# Patient Record
Sex: Female | Born: 1995 | Race: Black or African American | Hispanic: No | Marital: Single | Smoking: Never smoker
Health system: Southern US, Community
[De-identification: ages and names within clinical notes are randomized; demographics above are authoritative.]

---

## 2017-10-16 ENCOUNTER — Encounter (HOSPITAL_COMMUNITY): Payer: Self-pay | Admitting: Emergency Medicine

## 2017-10-16 ENCOUNTER — Emergency Department (HOSPITAL_COMMUNITY): Payer: BLUE CROSS/BLUE SHIELD

## 2017-10-16 ENCOUNTER — Emergency Department (HOSPITAL_COMMUNITY)
Admission: EM | Admit: 2017-10-16 | Discharge: 2017-10-17 | Disposition: A | Discharge status: Home / Self Care | Payer: BLUE CROSS/BLUE SHIELD | Attending: Emergency Medicine | Admitting: Emergency Medicine

## 2017-10-16 ENCOUNTER — Other Ambulatory Visit: Payer: Self-pay

## 2017-10-16 DIAGNOSIS — J181 Lobar pneumonia, unspecified organism: Secondary | ICD-10-CM | POA: Diagnosis not present

## 2017-10-16 DIAGNOSIS — R05 Cough: Secondary | ICD-10-CM | POA: Diagnosis present

## 2017-10-16 DIAGNOSIS — J189 Pneumonia, unspecified organism: Secondary | ICD-10-CM

## 2017-10-16 LAB — I-STAT CG4 LACTIC ACID, ED
LACTIC ACID, VENOUS: 1.11 mmol/L (ref 0.5–1.9)
LACTIC ACID, VENOUS: 1.51 mmol/L (ref 0.5–1.9)

## 2017-10-16 LAB — CBC WITH DIFFERENTIAL/PLATELET
Abs Immature Granulocytes: 0 10*3/uL (ref 0.0–0.1)
BASOS ABS: 0 10*3/uL (ref 0.0–0.1)
BASOS PCT: 0 %
EOS ABS: 0.2 10*3/uL (ref 0.0–0.7)
Eosinophils Relative: 3 %
HCT: 37.8 % (ref 36.0–46.0)
Hemoglobin: 12.3 g/dL (ref 12.0–15.0)
IMMATURE GRANULOCYTES: 1 %
Lymphocytes Relative: 18 %
Lymphs Abs: 1.1 10*3/uL (ref 0.7–4.0)
MCH: 29.3 pg (ref 26.0–34.0)
MCHC: 32.5 g/dL (ref 30.0–36.0)
MCV: 90 fL (ref 78.0–100.0)
Monocytes Absolute: 0.7 10*3/uL (ref 0.1–1.0)
Monocytes Relative: 12 %
NEUTROS ABS: 3.9 10*3/uL (ref 1.7–7.7)
NEUTROS PCT: 66 %
Platelets: 176 10*3/uL (ref 150–400)
RBC: 4.2 MIL/uL (ref 3.87–5.11)
RDW: 11.9 % (ref 11.5–15.5)
WBC: 5.9 10*3/uL (ref 4.0–10.5)

## 2017-10-16 LAB — POC URINE PREG, ED: PREG TEST UR: NEGATIVE

## 2017-10-16 LAB — URINALYSIS, ROUTINE W REFLEX MICROSCOPIC
BILIRUBIN URINE: NEGATIVE
Glucose, UA: NEGATIVE mg/dL
KETONES UR: 80 mg/dL — AB
LEUKOCYTES UA: NEGATIVE
NITRITE: NEGATIVE
PH: 5 (ref 5.0–8.0)
Protein, ur: 100 mg/dL — AB
RBC / HPF: >50 RBC/hpf — ABNORMAL HIGH (ref 0–5)
Specific Gravity, Urine: 1.03 (ref 1.005–1.030)

## 2017-10-16 LAB — BASIC METABOLIC PANEL
ANION GAP: 13 (ref 5–15)
BUN: 12 mg/dL (ref 6–20)
CO2: 23 mmol/L (ref 22–32)
Calcium: 8.7 mg/dL — ABNORMAL LOW (ref 8.9–10.3)
Chloride: 98 mmol/L (ref 98–111)
Creatinine, Ser: 0.91 mg/dL (ref 0.44–1.00)
GFR calc Af Amer: >60 mL/min (ref >60)
GFR calc non Af Amer: >60 mL/min (ref >60)
GLUCOSE: 108 mg/dL — AB (ref 70–99)
POTASSIUM: 3.8 mmol/L (ref 3.5–5.1)
Sodium: 134 mmol/L — ABNORMAL LOW (ref 135–145)

## 2017-10-16 LAB — GROUP A STREP BY PCR: GROUP A STREP BY PCR: NOT DETECTED

## 2017-10-16 MED ORDER — AZITHROMYCIN 250 MG PO TABS
1000.0000 mg | ORAL_TABLET | Freq: Once | ORAL | Status: DC
  Filled 2017-10-16: qty 4

## 2017-10-16 MED ORDER — SODIUM CHLORIDE 0.9 % IV BOLUS
2000.0000 mL | Freq: Once | INTRAVENOUS | Status: AC
  Administered 2017-10-16: 2000 mL via INTRAVENOUS

## 2017-10-16 MED ORDER — SODIUM CHLORIDE 0.9 % IV BOLUS (SEPSIS): 1000.0000 mL | Freq: Once | INTRAVENOUS | Status: DC

## 2017-10-16 MED ORDER — SODIUM CHLORIDE 0.9 % IV SOLN
1.0000 g | Freq: Once | INTRAVENOUS | Status: AC
  Administered 2017-10-16: 1 g via INTRAVENOUS
  Filled 2017-10-16: qty 10

## 2017-10-16 MED ORDER — SODIUM CHLORIDE 0.9 % IV BOLUS (SEPSIS): 250.0000 mL | Freq: Once | INTRAVENOUS | Status: DC

## 2017-10-16 MED ORDER — IPRATROPIUM-ALBUTEROL 0.5-2.5 (3) MG/3ML IN SOLN
3.0000 mL | Freq: Once | RESPIRATORY_TRACT | Status: AC
  Administered 2017-10-16: 3 mL via RESPIRATORY_TRACT
  Filled 2017-10-16: qty 3

## 2017-10-16 MED ORDER — ACETAMINOPHEN 500 MG PO TABS
1000.0000 mg | ORAL_TABLET | Freq: Once | ORAL | Status: AC
  Administered 2017-10-16: 1000 mg via ORAL
  Filled 2017-10-16: qty 2

## 2017-10-16 MED ORDER — SODIUM CHLORIDE 0.9 % IV SOLN
500.0000 mg | Freq: Once | INTRAVENOUS | Status: AC
  Administered 2017-10-17: 500 mg via INTRAVENOUS
  Filled 2017-10-16: qty 500

## 2017-10-16 NOTE — ED Notes (Signed)
PA at bedside will collect 2nd set of blood cultures once PA is done.

## 2017-10-16 NOTE — ED Provider Notes (Signed)
Patient placed in Quick Look pathway, seen and evaluated   Chief Complaint: flu like symptoms  HPI: Courtney Fry is a 22 y.o. female who presents to the ED with fever, chills, body aches, diarrhea. Patient reports the symptoms started a few days ago and she went to her student health and was evaluated and told she had a URI. Patient reports that since then she has gotten worse.   ROS: General: fever, chills, body aches  Resp: cough  ENT: sore throat  Physical Exam:  BP 108/77 (BP Location: Right Arm)   Pulse (!) 130   Temp (!) 103.3 F (39.6 C) (Oral)   Resp 16   Ht 5\' 7"  (1.702 m)   Wt 74.8 kg   LMP 10/16/2017   SpO2 100%   BMI 25.84 kg/m    Gen: No distress  Neuro: Awake and Alert  Skin: increased warmth due to fever  Heart: tachycarida  Lungs: decreased breath sounds bilateral lower    Initiation of care has begun. The patient has been counseled on the process, plan, and necessity for staying for the completion/evaluation, and the remainder of the medical screening examination    Janne Napoleoneese, Lilya Smitherman M, NP 10/16/17 1945    Charlynne PanderYao, David Hsienta, MD 10/16/17 2351

## 2017-10-16 NOTE — ED Triage Notes (Signed)
Pt reports generalized body aches, N/V, fever, productive cough with white phlegm and sore throat that started this past week. 103.28F fever in triage.

## 2017-10-16 NOTE — ED Provider Notes (Signed)
[facility] EMERGENCY DEPARTMENT Provider Note   CSN: [id number] Arrival date & time: 10/16/17  1848     History   Chief Complaint Chief Complaint  Patient presents with  . URI  . Sore Throat    HPI Courtney Fry is a 22 y.o. female.  Patient presents to the emergency department with a chief complaint of cough, body aches, and fever.  She reports that she has been feeling sick for the past few days.  She was diagnosed with a URI at student health.  She reports no improvement of her symptoms.  She has tried OTC medications with little relief.  She reports having some shortness of breath, but denies any productive cough.  She states that she feels weak and fatigued.  The history is provided by the patient. No language interpreter was used.    History reviewed. No pertinent past medical history.  There are no active problems to display for this patient.   History reviewed. No pertinent surgical history.   OB History   None      Home Medications    Prior to Admission medications   Not on File    Family History No family history on file.  Social History Social History   Tobacco Use  . Smoking status: Never Smoker  . Smokeless tobacco: Never Used  Substance Use Topics  . Alcohol use: Never    Frequency: Never  . Drug use: Never     Allergies   Patient has no known allergies.   Review of Systems Review of Systems  All other systems reviewed and are negative.    Physical Exam Updated Vital Signs BP 115/84   Pulse (!) 112   Temp 99.5 F (37.5 C)   Resp 18   Ht 5\' 7"  (1.702 m)   Wt 74.8 kg   LMP 10/16/2017   SpO2 93%   BMI 25.84 kg/m   Physical Exam  Constitutional: She is oriented to person, place, and time. She appears well-developed and well-nourished.  HENT:  Head: Normocephalic and atraumatic.  Eyes: Pupils are equal, round, and reactive to light. Conjunctivae and EOM are normal.  Neck: Normal range of motion. Neck  supple.  Cardiovascular: Normal rate and regular rhythm. Exam reveals no gallop and no friction rub.  No murmur heard. Pulmonary/Chest: Effort normal. No respiratory distress. She has no wheezes. She has rales. She exhibits no tenderness.  Abdominal: Soft. Bowel sounds are normal. She exhibits no distension and no mass. There is no tenderness. There is no rebound and no guarding.  Musculoskeletal: Normal range of motion. She exhibits no edema or tenderness.  Neurological: She is alert and oriented to person, place, and time.  Skin: Skin is warm and dry.  Psychiatric: She has a normal mood and affect. Her behavior is normal. Judgment and thought content normal.  Nursing note and vitals reviewed.    ED Treatments / Results  Labs (all labs ordered are listed, but only abnormal results are displayed) Labs Reviewed  BASIC METABOLIC PANEL - Abnormal; Notable for the following components:      Result Value   Sodium 134 (*)    Glucose, Bld 108 (*)    Calcium 8.7 (*)    All other components within normal limits  URINALYSIS, ROUTINE W REFLEX MICROSCOPIC - Abnormal; Notable for the following components:   Color, Urine AMBER (*)    APPearance HAZY (*)    Hgb urine dipstick LARGE (*)    Ketones, ur  80 (*)    Protein, ur 100 (*)    RBC / HPF >50 (*)    Bacteria, UA FEW (*)    All other components within normal limits  GROUP A STREP BY PCR  CULTURE, BLOOD (ROUTINE X 2)  CULTURE, BLOOD (ROUTINE X 2)  CBC WITH DIFFERENTIAL/PLATELET  I-STAT CG4 LACTIC ACID, ED  POC URINE PREG, ED  I-STAT CG4 LACTIC ACID, ED    EKG None  Radiology Dg Chest 2 View  Result Date: 10/16/2017 CLINICAL DATA:  Pt reports being sick since Tuesday of this week. States that she saw the health center at school and they diagnosed her with a virus. Pt states that her symptoms have not gotten any better since then. C/o cough that changes from dry to productive. Fever of 103.3 today. Reports nausea and vomiting but  denies diarrhea. Pt reports chest pain due to constant cough. Also reports feeling sob with exertion. Non smoker. EXAM: CHEST - 2 VIEW COMPARISON:  None. FINDINGS: There is consolidation best seen on the frontal view, silhouetting the left heart border extending from the left hilum inferiorly, consistent with left upper lobe pneumonia. Right lung is clear. No pleural effusion or pneumothorax. Heart is normal in size. No mediastinal or hilar masses. No convincing adenopathy. Skeletal structures are unremarkable. IMPRESSION: Left lung pneumonia, which projects in the left upper lobe lingula. Electronically Signed   By: Amie Portlandavid  Ormond M.D.   On: 10/16/2017 20:36    Procedures Procedures (including critical care time)  Medications Ordered in ED Medications  sodium chloride 0.9 % bolus 2,000 mL (2,000 mLs Intravenous New Bag/Given 10/16/17 2252)  azithromycin (ZITHROMAX) tablet 1,000 mg (has no administration in time range)  cefTRIAXone (ROCEPHIN) 1 g in sodium chloride 0.9 % 100 mL IVPB (has no administration in time range)  ipratropium-albuterol (DUONEB) 0.5-2.5 (3) MG/3ML nebulizer solution 3 mL (has no administration in time range)  sodium chloride 0.9 % bolus 1,000 mL (has no administration in time range)    And  sodium chloride 0.9 % bolus 1,000 mL (has no administration in time range)    And  sodium chloride 0.9 % bolus 250 mL (has no administration in time range)  acetaminophen (TYLENOL) tablet 1,000 mg (1,000 mg Oral Given 10/16/17 1939)     Initial Impression / Assessment and Plan / ED Course  I have reviewed the triage vital signs and the nursing notes.  Pertinent labs & imaging results that were available during my care of the patient were reviewed by me and considered in my medical decision making (see chart for details).     Patient here with fever, chills, body aches, cough.  She is noted to have a temperature of 103.3, heart rate of 130, and has O2 saturation of 89% on room air  while I am examining her.  Chest x-ray shows pneumonia as source of infection.  She meets sirs criteria with source, will activate code sepsis.  We will give weight-based fluid boluses and start IV antibiotics.  Has received weight-based fluids.  She is feeling improved.  Breathing improved after nebulizer treatment.  On repeat exam at 0135, her heart rate remains in the 110s.  Her O2 saturation is 95 to 98% on room air.  She feels improved.  I do not feel that she requires admission at this time.  We did discuss benefits of admission versus discharge.  At this time, patient and family member are comfortable with being discharged and being treated on an outpatient  basis.  Strict return precautions have been given.  Final Clinical Impressions(s) / ED Diagnoses   Final diagnoses:  Community acquired pneumonia of left lower lobe of lung [facility])    ED Discharge Orders         Ordered    amoxicillin-clavulanate (AUGMENTIN) 875-125 MG tablet  Every 12 hours     10/17/17 0138           Roxy Horseman, PA-C 10/17/17 0140    Gilda Crease, MD 10/17/17 [phone]

## 2017-10-17 MED ORDER — AMOXICILLIN-POT CLAVULANATE 875-125 MG PO TABS: 1.0000 | ORAL_TABLET | Freq: Two times a day (BID) | ORAL | 0 refills | Status: AC

## 2017-10-17 NOTE — ED Notes (Signed)
While ambulating pt hr was 140-150, O2 92-96%. Coughing heavily.

## 2017-10-17 NOTE — Discharge Instructions (Signed)
Return to the ER for new or worsening symptoms, including fever >104, confusion, delirium, or inability to eat or drink.

## 2017-10-22 LAB — CULTURE, BLOOD (ROUTINE X 2)
CULTURE: NO GROWTH
Culture: NO GROWTH

## 2019-08-16 IMAGING — DX DG CHEST 2V
2 series · 2 of 2 positions shown · non-contrast
Comparison: None.

CLINICAL DATA: Pt reports being sick since [REDACTED] of this week.
States that she saw the [HOSPITAL] at school and they diagnosed
her with a virus. Pt states that her symptoms have not gotten any
better since then. C/o cough that changes from dry to productive.
Fever of 103.3 today. Reports nausea and vomiting but denies
diarrhea. Pt reports chest pain due to constant cough. Also reports
feeling sob with exertion. Non smoker.

EXAM:
CHEST - 2 VIEW

[w chest pa]
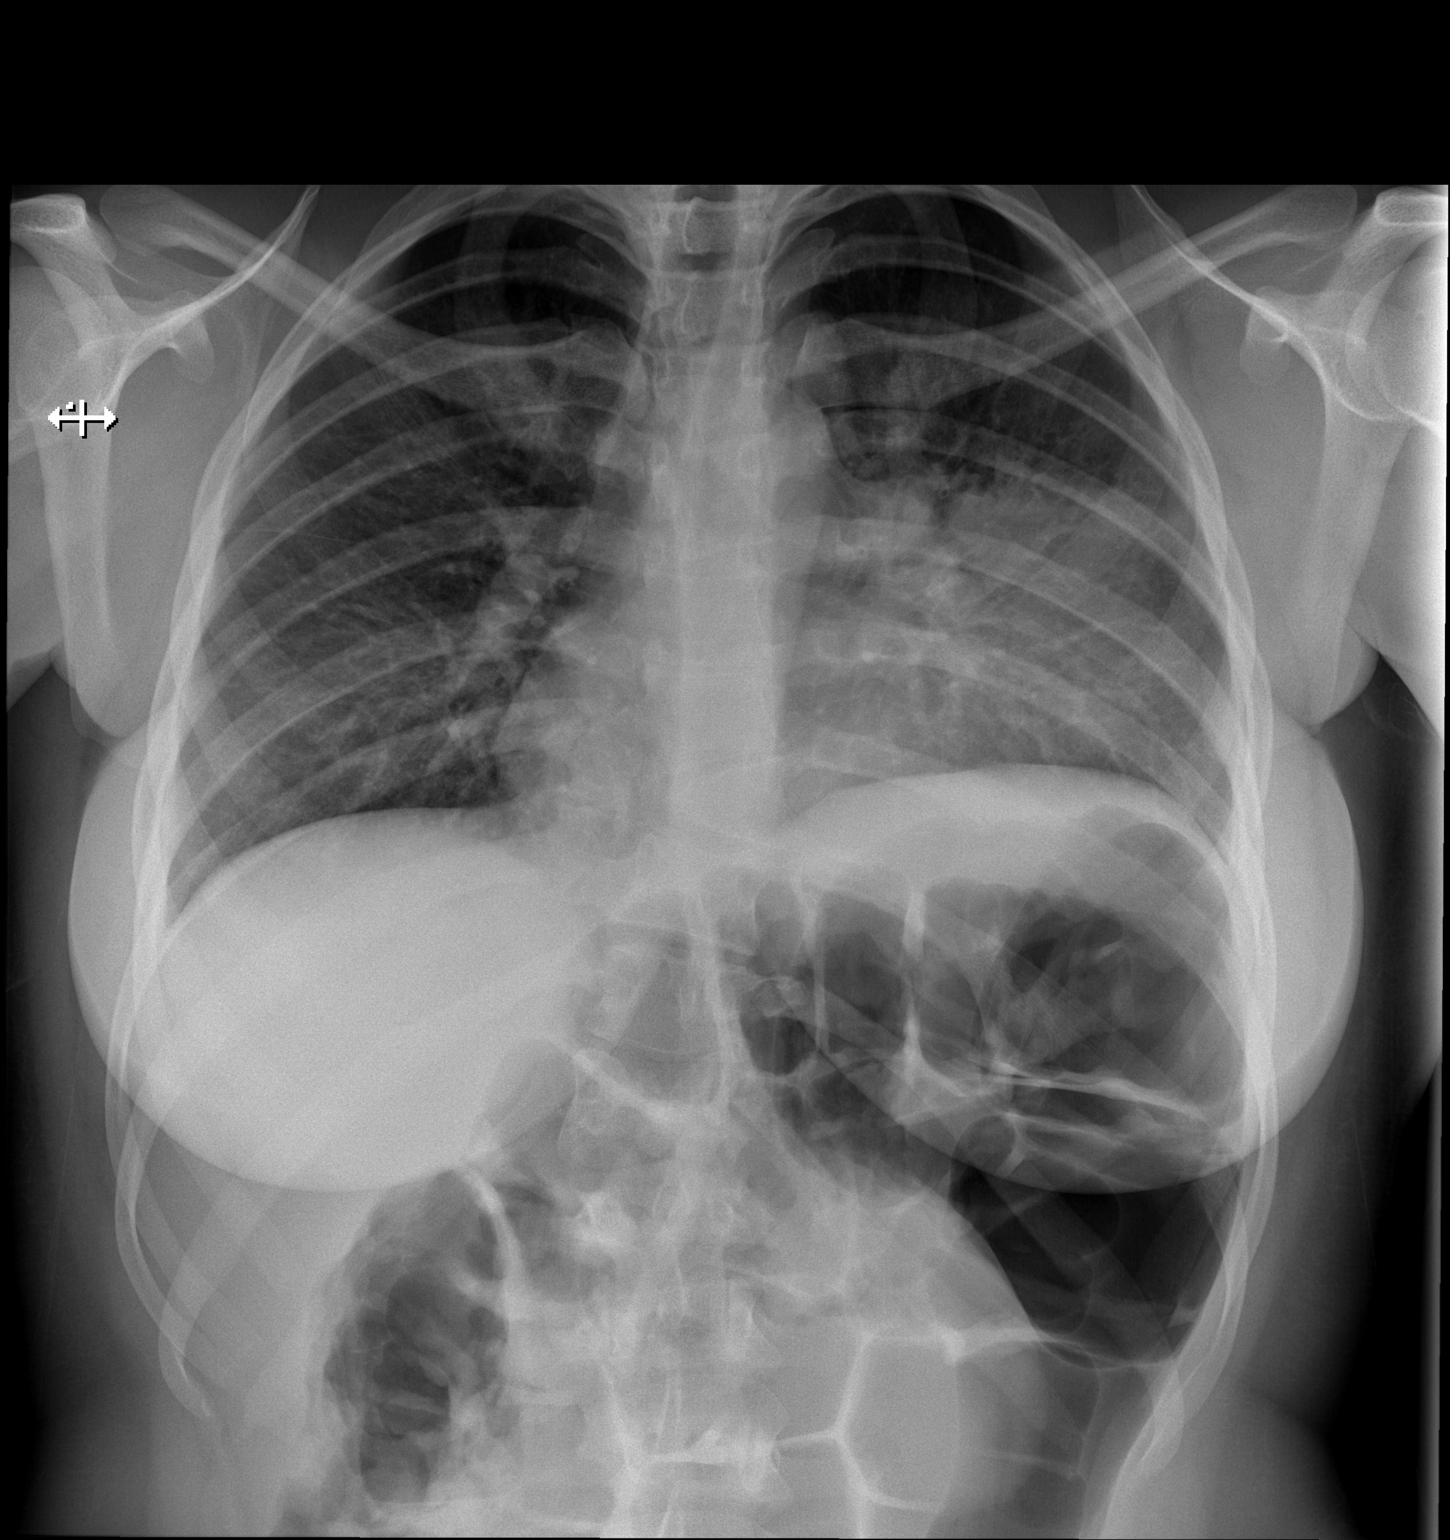

[w chest lat]
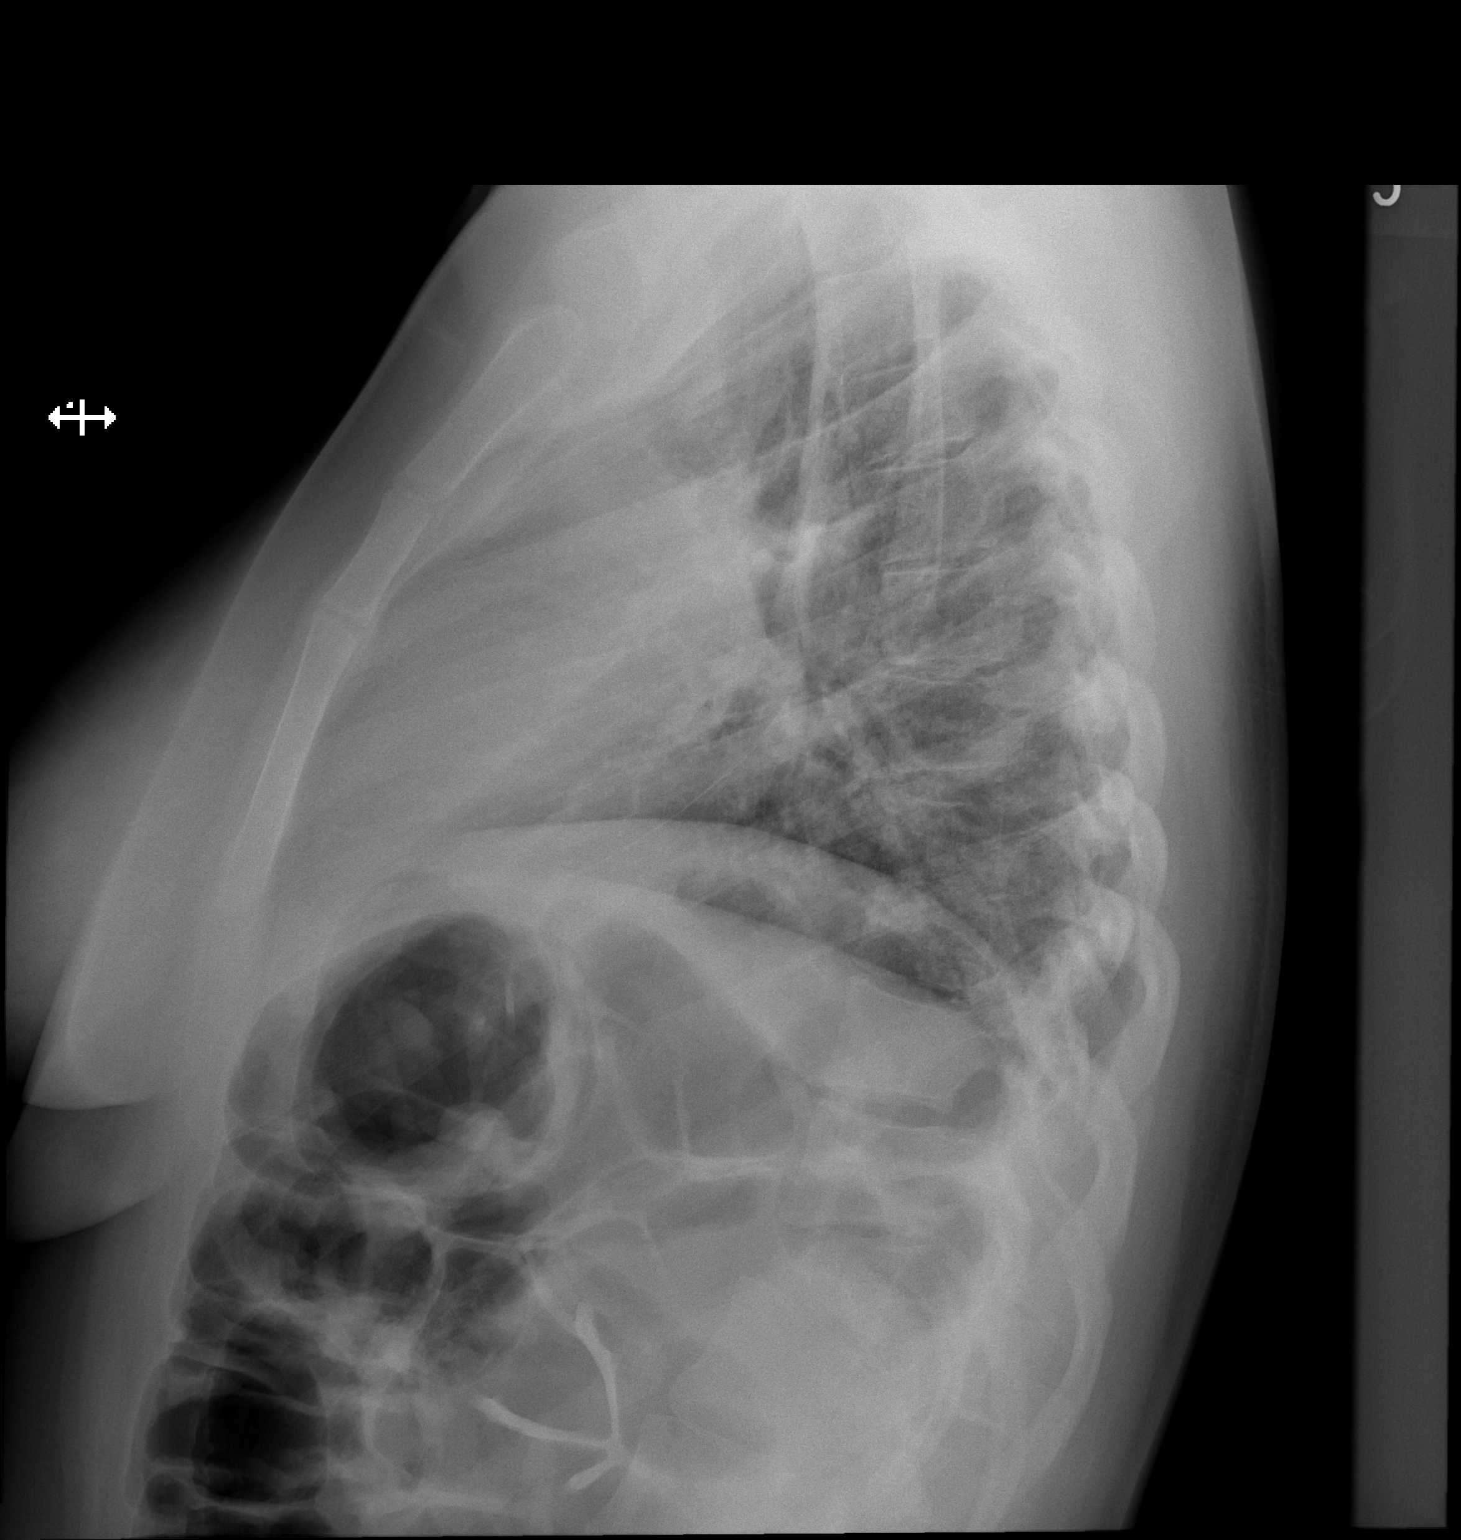

[2 of 2 positions shown; findings below may reference images not displayed]

FINDINGS: There is consolidation best seen on the frontal view, silhouetting
the left heart border extending from the left hilum inferiorly,
consistent with left upper lobe pneumonia. Right lung is clear.

No pleural effusion or pneumothorax.

Heart is normal in size. No mediastinal or hilar masses. No
convincing adenopathy.

Skeletal structures are unremarkable.
IMPRESSION: Left lung pneumonia, which projects in the left upper lobe lingula.
